# Patient Record
Sex: Male | Born: 1975 | Race: White | Hispanic: No | Marital: Single | Smoking: Current some day smoker
Health system: Southern US, Community
[De-identification: ages and names within clinical notes are randomized; demographics above are authoritative.]

## PROBLEM LIST (undated history)

## (undated) DIAGNOSIS — M722 Plantar fascial fibromatosis: Secondary | ICD-10-CM

---

## 2000-06-23 ENCOUNTER — Encounter: Payer: Self-pay | Admitting: Emergency Medicine

## 2000-06-23 ENCOUNTER — Emergency Department (HOSPITAL_COMMUNITY): Admission: EM | Admit: 2000-06-23 | Discharge: 2000-06-23 | Payer: Self-pay | Admitting: Emergency Medicine

## 2014-02-17 ENCOUNTER — Emergency Department (HOSPITAL_COMMUNITY)
Admission: EM | Admit: 2014-02-17 | Discharge: 2014-02-17 | Discharge status: Against Medical Advice | Payer: Self-pay | Attending: Emergency Medicine | Admitting: Emergency Medicine

## 2014-02-17 DIAGNOSIS — Z720 Tobacco use: Secondary | ICD-10-CM | POA: Insufficient documentation

## 2014-02-17 DIAGNOSIS — Z5329 Procedure and treatment not carried out because of patient's decision for other reasons: Secondary | ICD-10-CM | POA: Insufficient documentation
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
## 2014-02-19 ENCOUNTER — Encounter (HOSPITAL_COMMUNITY): Payer: Self-pay

## 2014-02-19 ENCOUNTER — Emergency Department (HOSPITAL_COMMUNITY)
Admission: EM | Admit: 2014-02-19 | Discharge: 2014-02-19 | Disposition: A | Discharge status: Home / Self Care | Payer: Self-pay | Attending: Emergency Medicine | Admitting: Emergency Medicine

## 2014-02-19 DIAGNOSIS — M722 Plantar fascial fibromatosis: Secondary | ICD-10-CM | POA: Insufficient documentation

## 2014-02-19 DIAGNOSIS — H53149 Visual discomfort, unspecified: Secondary | ICD-10-CM | POA: Insufficient documentation

## 2014-02-19 DIAGNOSIS — Z760 Encounter for issue of repeat prescription: Secondary | ICD-10-CM | POA: Insufficient documentation

## 2014-02-19 DIAGNOSIS — Z720 Tobacco use: Secondary | ICD-10-CM | POA: Insufficient documentation

## 2014-02-19 HISTORY — DX: Plantar fascial fibromatosis: M72.2

## 2014-02-19 MED ORDER — TRAMADOL HCL 50 MG PO TABS: 50.0000 mg | ORAL_TABLET | Freq: Four times a day (QID) | ORAL | Status: DC | PRN

## 2014-02-19 MED ORDER — KETOROLAC TROMETHAMINE 60 MG/2ML IM SOLN
60.0000 mg | Freq: Once | INTRAMUSCULAR | Status: AC
  Administered 2014-02-19: 60 mg via INTRAMUSCULAR
  Filled 2014-02-19: qty 2

## 2014-02-19 MED ORDER — TRAMADOL HCL 50 MG PO TABS
50.0000 mg | ORAL_TABLET | Freq: Once | ORAL | Status: AC
  Administered 2014-02-19: 50 mg via ORAL
  Filled 2014-02-19: qty 1

## 2014-02-19 NOTE — ED Notes (Signed)
Correction-one rx
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
## 2014-02-19 NOTE — Discharge Instructions (Signed)
Plantar Fasciitis  Plantar fasciitis is a common condition that causes foot pain. It is soreness (inflammation) of the band of tough fibrous tissue on the bottom of the foot that runs from the heel bone (calcaneus) to the ball of the foot. The cause of this soreness may be from excessive standing, poor fitting shoes, running on hard surfaces, being overweight, having an abnormal walk, or overuse (this is common in runners) of the painful foot or feet. It is also common in aerobic exercise dancers and ballet dancers.  SYMPTOMS   Most people with plantar fasciitis complain of:   Severe pain in the morning on the bottom of their foot especially when taking the first steps out of bed. This pain recedes after a few minutes of walking.   Severe pain is experienced also during walking following a long period of inactivity.   Pain is worse when walking barefoot or up stairs  DIAGNOSIS    Your caregiver will diagnose this condition by examining and feeling your foot.   Special tests such as X-rays of your foot, are usually not needed.  PREVENTION    Consult a sports medicine professional before beginning a new exercise program.   Walking programs offer a good workout. With walking there is a lower chance of overuse injuries common to runners. There is less impact and less jarring of the joints.   Begin all new exercise programs slowly. If problems or pain develop, decrease the amount of time or distance until you are at a comfortable level.   Wear good shoes and replace them regularly.   Stretch your foot and the heel cords at the back of the ankle (Achilles tendon) both before and after exercise.   Run or exercise on even surfaces that are not hard. For example, asphalt is better than pavement.   Do not run barefoot on hard surfaces.   If using a treadmill, vary the incline.   Do not continue to workout if you have foot or joint problems. Seek professional help if they do not improve.  HOME CARE INSTRUCTIONS     Avoid activities that cause you pain until you recover.   Use ice or cold packs on the problem or painful areas after working out.   Only take over-the-counter or prescription medicines for pain, discomfort, or fever as directed by your caregiver.   Soft shoe inserts or athletic shoes with air or gel sole cushions may be helpful.   If problems continue or become more severe, consult a sports medicine caregiver or your own health care provider. Cortisone is a potent anti-inflammatory medication that may be injected into the painful area. You can discuss this treatment with your caregiver.  MAKE SURE YOU:    Understand these instructions.   Will watch your condition.   Will get help right away if you are not doing well or get worse.  Document Released: 12/01/2000 Document Revised: 05/31/2011 Document Reviewed: 01/31/2008  ExitCare Patient Information 2015 [employer]. This information is not intended to replace advice given to you by your health care provider. Make sure you discuss any questions you have with your health care provider.

## 2014-03-11 ENCOUNTER — Emergency Department (HOSPITAL_COMMUNITY): Payer: Self-pay

## 2014-03-11 ENCOUNTER — Encounter (HOSPITAL_COMMUNITY): Payer: Self-pay | Admitting: *Deleted

## 2014-03-11 ENCOUNTER — Emergency Department (HOSPITAL_COMMUNITY)
Admission: EM | Admit: 2014-03-11 | Discharge: 2014-03-11 | Disposition: A | Discharge status: Home / Self Care | Payer: Self-pay | Attending: Emergency Medicine | Admitting: Emergency Medicine

## 2014-03-11 DIAGNOSIS — M79671 Pain in right foot: Secondary | ICD-10-CM | POA: Insufficient documentation

## 2014-03-11 DIAGNOSIS — M722 Plantar fascial fibromatosis: Secondary | ICD-10-CM | POA: Insufficient documentation

## 2014-03-11 DIAGNOSIS — Z720 Tobacco use: Secondary | ICD-10-CM | POA: Insufficient documentation

## 2014-03-11 MED ORDER — TRAMADOL HCL 50 MG PO TABS: 50.0000 mg | ORAL_TABLET | Freq: Four times a day (QID) | ORAL | Status: DC | PRN

## 2014-03-11 MED ORDER — KETOROLAC TROMETHAMINE 60 MG/2ML IM SOLN
30.0000 mg | Freq: Once | INTRAMUSCULAR | Status: AC
  Administered 2014-03-11: 30 mg via INTRAMUSCULAR
  Filled 2014-03-11: qty 2

## 2014-03-11 NOTE — ED Provider Notes (Signed)
CSN: [id number]     Arrival date & time 03/11/14  0818 History   First MD Initiated Contact with Patient 03/11/14 [phone]     No chief complaint on file.    (Consider location/radiation/quality/duration/timing/severity/associated sxs/prior Treatment) Patient is a 38 y.o. male presenting with lower extremity pain.  Foot Pain This is a new problem. Episode onset: 4 days. The problem occurs constantly. The problem has not changed since onset.Pertinent negatives include no chest pain, no abdominal pain and no shortness of breath. Exacerbated by: standing, palpation, flexion/extension. Nothing relieves the symptoms. He has tried a cold compress (nsaids, indomethacin) for the symptoms. The treatment provided moderate relief.    Past Medical History  Diagnosis Date  . Plantar fasciitis    No past surgical history on file. No family history on file. History  Substance Use Topics  . Smoking status: Current Some Day Smoker  . Smokeless tobacco: Not on file  . Alcohol Use: No    Review of Systems  Respiratory: Negative for shortness of breath.   Cardiovascular: Negative for chest pain.  Gastrointestinal: Negative for abdominal pain.  All other systems reviewed and are negative.     Allergies  Review of patient's allergies indicates no known allergies.  Home Medications   Prior to Admission medications   Medication Sig Start Date End Date Taking? Authorizing Provider  acetaminophen (TYLENOL) 500 MG tablet Take 500 mg by mouth 2 (two) times daily as needed for moderate pain.    Historical Provider, MD  indomethacin (INDOCIN) 25 MG capsule Take 25-50 mg by mouth every 8 (eight) hours as needed for moderate pain.    Historical Provider, MD  traMADol (ULTRAM) 50 MG tablet Take 1 tablet (50 mg total) by mouth every 6 (six) hours as needed. 02/19/14   Tiffany Irine SealG Greene, PA-C  traMADol (ULTRAM) 50 MG tablet Take 50 mg by mouth every 6 (six) hours as needed for moderate pain.    Historical  Provider, MD   There were no vitals taken for this visit. Physical Exam  Constitutional: He is oriented to person, place, and time. He appears well-developed and well-nourished.  HENT:  Head: Normocephalic and atraumatic.  Eyes: Conjunctivae and EOM are normal.  Neck: Normal range of motion. Neck supple.  Cardiovascular: Normal rate, regular rhythm and normal heart sounds.   Pulmonary/Chest: Effort normal and breath sounds normal. No respiratory distress.  Abdominal: He exhibits no distension. There is no tenderness. There is no rebound and no guarding.  Musculoskeletal: Normal range of motion.       Right foot: There is tenderness and bony tenderness. There is no swelling and no deformity.  2+ dp and pt pulses  Neurological: He is alert and oriented to person, place, and time.  Skin: Skin is warm and dry.  Vitals reviewed.   ED Course  Procedures (including critical care time) Labs Review Labs Reviewed - No data to display  Imaging Review No results found.   EKG Interpretation None      MDM   Final diagnoses:  None    38 y.o. male with pertinent PMH of plantar fascitis presents with recurrent R foot pain identical to prior flares over the last year.  Exam as above with tenderness, however nothing focal and this is mild.  XR unremarkable.  This is likely plantar fascitis.  I discussed with the patient that treatment of chronic pain in the emergency department is inappropriate and strongly encouraged him to follow-up with his primary care doctor and  either an orthopedist or a podiatrist. I informed him that I would prescribe Ultram this time, however it was imperative that he followed up for his chronic pain.  DC home in stable condition  I have reviewed all laboratory and imaging studies if ordered as above  1. Right foot pain         Mirian MoMatthew Gentry, MD 03/11/14 [phone]

## 2014-03-11 NOTE — Progress Notes (Signed)
  CARE MANAGEMENT ED NOTE 03/11/2014  Patient:  Scott Abbott,Scott Abbott   Account Number:  [id number]  Date Initiated:  03/11/2014  Documentation initiated by:  Edd ArbourGIBBS,KIMBERLY  Subjective/Objective Assessment:   38 yr old self pay [place] pt hx of right foot plantar fascitis x1 year.. Has taken tramadol in the past. Pt is out of medications. 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 Pt has tried     Subjective/Objective Assessment Detail:   aleve and motrin as well with no relief.         no pcp listed Pt requested medication assistance per ED RN, Silas FloodSheila  [email or web address] [facility] 50 mg for $40     Action/Plan:   ED CM spoke with ED US, KAren, ED RN Velna HatchetSheila about pt medication assistance Cm discussed cost at [facility] and [facility] informed by ED RN and he left prior to CM review of need for pcp for further Rx assistance for   Action/Plan Detail:   chronic issue with plantar fascitis x 1 yr   Anticipated DC Date:  03/11/2014     Status Recommendation to Physician:   Result of Recommendation:    Other ED Services  Consult Working Plan    DC Planning Services  Other  Outpatient Services - Pt will follow up  Medication Assistance  PCP issues    Choice offered to / List presented to:            Status of service:  Completed, signed off  ED Comments:   ED Comments Detail:

## 2014-03-11 NOTE — ED Notes (Signed)
Pt reports hx of right foot plantar fascitis x1 year.. Has taken tramadol in the past. Pt is out of medications. 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 Pt has tried aleve and motrin as well with no relief.

## 2014-03-11 NOTE — Discharge Instructions (Signed)
Plantar Fasciitis  Plantar fasciitis is a common condition that causes foot pain. It is soreness (inflammation) of the band of tough fibrous tissue on the bottom of the foot that runs from the heel bone (calcaneus) to the ball of the foot. The cause of this soreness may be from excessive standing, poor fitting shoes, running on hard surfaces, being overweight, having an abnormal walk, or overuse (this is common in runners) of the painful foot or feet. It is also common in aerobic exercise dancers and ballet dancers.  SYMPTOMS   Most people with plantar fasciitis complain of:   Severe pain in the morning on the bottom of their foot especially when taking the first steps out of bed. This pain recedes after a few minutes of walking.   Severe pain is experienced also during walking following a long period of inactivity.   Pain is worse when walking barefoot or up stairs  DIAGNOSIS    Your caregiver will diagnose this condition by examining and feeling your foot.   Special tests such as X-rays of your foot, are usually not needed.  PREVENTION    Consult a sports medicine professional before beginning a new exercise program.   Walking programs offer a good workout. With walking there is a lower chance of overuse injuries common to runners. There is less impact and less jarring of the joints.   Begin all new exercise programs slowly. If problems or pain develop, decrease the amount of time or distance until you are at a comfortable level.   Wear good shoes and replace them regularly.   Stretch your foot and the heel cords at the back of the ankle (Achilles tendon) both before and after exercise.   Run or exercise on even surfaces that are not hard. For example, asphalt is better than pavement.   Do not run barefoot on hard surfaces.   If using a treadmill, vary the incline.   Do not continue to workout if you have foot or joint problems. Seek professional help if they do not improve.  HOME CARE INSTRUCTIONS     Avoid activities that cause you pain until you recover.   Use ice or cold packs on the problem or painful areas after working out.   Only take over-the-counter or prescription medicines for pain, discomfort, or fever as directed by your caregiver.   Soft shoe inserts or athletic shoes with air or gel sole cushions may be helpful.   If problems continue or become more severe, consult a sports medicine caregiver or your own health care provider. Cortisone is a potent anti-inflammatory medication that may be injected into the painful area. You can discuss this treatment with your caregiver.  MAKE SURE YOU:    Understand these instructions.   Will watch your condition.   Will get help right away if you are not doing well or get worse.  Document Released: 12/01/2000 Document Revised: 05/31/2011 Document Reviewed: 01/31/2008  ExitCare Patient Information 2015 [employer]. This information is not intended to replace advice given to you by your health care provider. Make sure you discuss any questions you have with your health care provider.

## 2014-03-18 ENCOUNTER — Emergency Department (HOSPITAL_COMMUNITY)
Admission: EM | Admit: 2014-03-18 | Discharge: 2014-03-18 | Discharge status: Against Medical Advice | Payer: Self-pay | Attending: Emergency Medicine | Admitting: Emergency Medicine

## 2014-03-18 ENCOUNTER — Encounter (HOSPITAL_COMMUNITY): Payer: Self-pay | Admitting: Emergency Medicine

## 2014-03-18 DIAGNOSIS — Z720 Tobacco use: Secondary | ICD-10-CM | POA: Insufficient documentation

## 2014-03-18 DIAGNOSIS — Z008 Encounter for other general examination: Secondary | ICD-10-CM | POA: Insufficient documentation

## 2014-03-18 DIAGNOSIS — M79673 Pain in unspecified foot: Secondary | ICD-10-CM | POA: Insufficient documentation

## 2014-03-18 DIAGNOSIS — G8929 Other chronic pain: Secondary | ICD-10-CM | POA: Insufficient documentation
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
## 2018-12-13 ENCOUNTER — Emergency Department
Admission: EM | Admit: 2018-12-13 | Discharge: 2018-12-13 | Disposition: A | Discharge status: Home / Self Care | Payer: Self-pay | Attending: Emergency Medicine | Admitting: Emergency Medicine

## 2018-12-13 ENCOUNTER — Other Ambulatory Visit: Payer: Self-pay

## 2018-12-13 ENCOUNTER — Encounter: Payer: Self-pay | Admitting: Emergency Medicine

## 2018-12-13 DIAGNOSIS — G8929 Other chronic pain: Secondary | ICD-10-CM | POA: Insufficient documentation

## 2018-12-13 DIAGNOSIS — Z765 Malingerer [conscious simulation]: Secondary | ICD-10-CM | POA: Insufficient documentation

## 2018-12-13 DIAGNOSIS — F17210 Nicotine dependence, cigarettes, uncomplicated: Secondary | ICD-10-CM | POA: Insufficient documentation

## 2018-12-13 DIAGNOSIS — R2241 Localized swelling, mass and lump, right lower limb: Secondary | ICD-10-CM | POA: Insufficient documentation

## 2018-12-13 DIAGNOSIS — M79671 Pain in right foot: Secondary | ICD-10-CM | POA: Insufficient documentation

## 2018-12-13 MED ORDER — TRAMADOL HCL 50 MG PO TABS
50.0000 mg | ORAL_TABLET | Freq: Once | ORAL | Status: AC
  Administered 2018-12-13: 15:00:00 50 mg via ORAL
  Filled 2018-12-13: qty 1

## 2018-12-13 MED ORDER — LIDOCAINE 5 % EX PTCH
1.0000 | MEDICATED_PATCH | CUTANEOUS | Status: DC
  Administered 2018-12-13: 1 via TRANSDERMAL
  Filled 2018-12-13: qty 1

## 2018-12-13 NOTE — ED Notes (Signed)
Per charge RN, pt to be d/c and can see another provider as needed.

## 2018-12-13 NOTE — ED Notes (Signed)
Pt refused vital signs and refused to sign for d/c. Given d/c paperwork. Pt wheeled out to lobby by significant other.

## 2018-12-13 NOTE — ED Notes (Addendum)
Lou-ann from pt relations to speak with pt soon.

## 2018-12-13 NOTE — ED Triage Notes (Signed)
Pt in via POV, reports severe pain to right heel due to plantar fasciitis, states he has been unable to bear weight for 3-4 days.  Ace wrap in place.  NAD noted at this time.

## 2018-12-13 NOTE — ED Provider Notes (Addendum)
[facility] Emergency Department Provider Note  ____________________________________________   First MD Initiated Contact with Patient 12/13/18 1503     (approximate)  I have reviewed the triage vital signs and the nursing notes.   HISTORY  Chief Complaint Foot Pain    HPI Rodrigo Mcgranahan is a 43 y.o. male presents to the ED complaining of chronic foot pain.  Patient states he ran out of his medications.  He usually takes tramadol.  No new injury.  States just worsening. States he has been through all nsaids , steroids, and other treatments.  Being sent to pain clinic but needs more meds now    Past Medical History:  Diagnosis Date  . Plantar fasciitis     There are no active problems to display for this patient.   History reviewed. No pertinent surgical history.  Prior to Admission medications   Medication Sig Start Date End Date Taking? Authorizing Provider  acetaminophen (TYLENOL) 500 MG tablet Take 500 mg by mouth 2 (two) times daily as needed for moderate pain.    [provider]  indomethacin (INDOCIN) 25 MG capsule Take 25-50 mg by mouth every 8 (eight) hours as needed for moderate pain.    [provider]  traMADol (ULTRAM) 50 MG tablet Take 1 tablet (50 mg total) by mouth every 6 (six) hours as needed. 03/11/14   Debby Freiberg, MD    Allergies Patient has no known allergies.  No family history on file.  Social History Social History   Tobacco Use  . Smoking status: Current Some Day Smoker    Types: Cigarettes, E-cigarettes  . Smokeless tobacco: Never Used  Substance Use Topics  . Alcohol use: No  . Drug use: No    Review of Systems  Constitutional: No fever/chills Eyes: No visual changes. ENT: No sore throat. Respiratory: Denies cough Genitourinary: Negative for dysuria. Musculoskeletal: Negative for back pain.  Positive for chronic foot pain Skin: Negative for rash.     ____________________________________________   PHYSICAL EXAM:  VITAL SIGNS: ED Triage Vitals  Enc Vitals Group     BP 12/13/18 1449 (!) 163/102     Pulse Rate 12/13/18 1449 75     Resp 12/13/18 1449 16     Temp 12/13/18 1449 98.1 F (36.7 C)     Temp Source 12/13/18 1449 Oral     SpO2 12/13/18 1449 97 %     Weight 12/13/18 1450 210 lb (95.3 kg)     Height 12/13/18 1450 5\' 11"  (1.803 m)     Head Circumference --      Peak Flow --      Pain Score 12/13/18 1449 10     Pain Loc --      Pain Edu? --      Excl. in [place]? --     Constitutional: Alert and oriented. Well appearing and in no acute distress. Eyes: Conjunctivae are normal.  Head: Atraumatic. Nose: No congestion/rhinnorhea. Mouth/Throat: Mucous membranes are moist.   Neck:  supple no lymphadenopathy noted Cardiovascular: Normal rate, regular rhythm.  Respiratory: Normal respiratory effort.  No retractions,  GU: deferred Musculoskeletal: FROM all extremities, warm and well perfused, no swelling noted at the foot.  Patient does not even complain when I am pressed hard on the plantar aspect. Neurologic:  Normal speech and language.  Skin:  Skin is warm, dry and intact. No rash noted. Psychiatric: Mood and affect are normal. Speech and behavior are normal.  ____________________________________________   LABS (  all labs ordered are listed, but only abnormal results are displayed)  Labs Reviewed - No data to display ____________________________________________   ____________________________________________  RADIOLOGY    ____________________________________________   PROCEDURES  Procedure(s) performed: Lidoderm patch, tramadol 1 p.o.   Procedures    ____________________________________________   INITIAL IMPRESSION / ASSESSMENT AND PLAN / ED COURSE  Pertinent labs & imaging results that were available during my care of the patient were reviewed by me and considered in my medical decision making (see  chart for details).   Patient is 43 year old male presents emergency department requesting pain medication for his chronic pain of his foot.  Physical exam is unremarkable.  Explained to the patient that we cannot give him a prescription for chronic pain medication but we can give him something for pain here.  Patient becomes very loud and belligerent.  His PDMP score is 620.  He has 30 different prescribers for narcotics in different cities.  I have explained to him once again that we will not give him a prescription for narcotic but will be glad to give him something while he is here.  He was discharged in stable condition although unhappy.    Scorpio Fortin was evaluated in Emergency Department on 12/13/2018 for the symptoms described in the history of present illness. He was evaluated in the context of the global COVID-19 pandemic, which necessitated consideration that the patient might be at risk for infection with the SARS-CoV-2 virus that causes COVID-19. Institutional protocols and algorithms that pertain to the evaluation of patients at risk for COVID-19 are in a state of rapid change based on information released by regulatory bodies including the CDC and federal and state organizations. These policies and algorithms were followed during the patient's care in the ED.   As part of my medical decision making, I reviewed the following data within the electronic MEDICAL RECORD NUMBER Nursing notes reviewed and incorporated, Old chart reviewed, Notes from prior ED visits and [place] Controlled Substance Database  ____________________________________________   FINAL CLINICAL IMPRESSION(S) / ED DIAGNOSES  Final diagnoses:  Chronic foot pain, right  Drug-seeking behavior      NEW MEDICATIONS STARTED DURING THIS VISIT:  Discharge Medication List as of 12/13/2018  3:19 PM       Note:  This document was prepared using Dragon voice recognition software and may include unintentional dictation errors.     Faythe Ghee, PA-C 12/13/18 1528    Sherrie Mustache Roselyn Bering, PA-C 12/13/18 1624    Jene Every, MD 12/13/18 [phone]

## 2018-12-13 NOTE — ED Notes (Signed)
Pt c/o shin splints and plantar phacitis in both feet. C/o R foot/leg pain becoming worse. States ran out of meds. Pain has been getting worse since no meds and states swelling is worse. Slight swelling in foot but not major swelling noted. Pt had ace wrap applied tightly to foot/leg. Removed to assess. Pulses 2+. Foot warm.

## 2018-12-13 NOTE — ED Notes (Signed)
Per Lou-ann, pt requesting to have another provider see him before he is d/c. Will let charge RN know.

## 2018-12-13 NOTE — ED Notes (Signed)
Pt requesting to speak with pt relations. Will have pt relations come to pt's bedside as requested.

## 2019-11-06 ENCOUNTER — Emergency Department (HOSPITAL_COMMUNITY)
Admission: EM | Admit: 2019-11-06 | Discharge: 2019-11-06 | Disposition: A | Discharge status: Home / Self Care | Payer: Self-pay | Attending: Emergency Medicine | Admitting: Emergency Medicine

## 2019-11-06 ENCOUNTER — Encounter (HOSPITAL_COMMUNITY): Payer: Self-pay | Admitting: Emergency Medicine

## 2019-11-06 DIAGNOSIS — M722 Plantar fascial fibromatosis: Secondary | ICD-10-CM | POA: Insufficient documentation

## 2019-11-06 DIAGNOSIS — Y999 Unspecified external cause status: Secondary | ICD-10-CM | POA: Insufficient documentation

## 2019-11-06 DIAGNOSIS — Y939 Activity, unspecified: Secondary | ICD-10-CM | POA: Insufficient documentation

## 2019-11-06 DIAGNOSIS — Y9289 Other specified places as the place of occurrence of the external cause: Secondary | ICD-10-CM | POA: Insufficient documentation

## 2019-11-06 DIAGNOSIS — F17210 Nicotine dependence, cigarettes, uncomplicated: Secondary | ICD-10-CM | POA: Insufficient documentation

## 2019-11-06 DIAGNOSIS — X58XXXA Exposure to other specified factors, initial encounter: Secondary | ICD-10-CM | POA: Insufficient documentation

## 2019-11-06 MED ORDER — OXYCODONE-ACETAMINOPHEN 5-325 MG PO TABS
1.0000 | ORAL_TABLET | Freq: Once | ORAL | Status: AC
  Administered 2019-11-06: 1 via ORAL
  Filled 2019-11-06: qty 1

## 2019-11-06 MED ORDER — TRAMADOL HCL 50 MG PO TABS: 50.0000 mg | ORAL_TABLET | Freq: Three times a day (TID) | ORAL | 0 refills | Status: DC | PRN

## 2019-11-06 MED ORDER — DEXAMETHASONE 4 MG PO TABS: 4.0000 mg | ORAL_TABLET | Freq: Two times a day (BID) | ORAL | 0 refills | Status: DC

## 2019-11-06 MED ORDER — DEXAMETHASONE 4 MG PO TABS: 4.0000 mg | ORAL_TABLET | Freq: Two times a day (BID) | ORAL | 0 refills | Status: AC

## 2019-11-06 MED ORDER — DEXAMETHASONE 4 MG PO TABS
8.0000 mg | ORAL_TABLET | Freq: Once | ORAL | Status: AC
  Administered 2019-11-06: 8 mg via ORAL
  Filled 2019-11-06: qty 2

## 2019-11-06 MED ORDER — TRAMADOL HCL 50 MG PO TABS: 50.0000 mg | ORAL_TABLET | Freq: Three times a day (TID) | ORAL | 0 refills | Status: AC | PRN

## 2019-11-06 NOTE — ED Triage Notes (Signed)
Pt has bone spurs and plantar fascitis in right foot and having pains. Reports fell today at work off and now right foot pains are worse. Reports naproxen isnt helping with his pains and needs something else.

## 2019-11-11 NOTE — ED Provider Notes (Signed)
[facility]-EMERGENCY DEPT Provider Note   CSN: [id number] Arrival date & time: 11/06/19  1243     History Chief Complaint  Patient presents with  . Foot Injury    Scott Abbott is a 44 y.o. male.  HPI    43yM with R foot pain. No trauma. Has of plantar fasciitis. Feels like the same. .worsening over the past several days. No fever, swelling, redness.  Past Medical History:  Diagnosis Date  . Plantar fasciitis     There are no problems to display for this patient.   History reviewed. No pertinent surgical history.     No family history on file.  Social History   Tobacco Use  . Smoking status: Current Some Day Smoker    Types: Cigarettes, E-cigarettes  . Smokeless tobacco: Never Used  Vaping Use  . Vaping Use: Some days  Substance Use Topics  . Alcohol use: No  . Drug use: No    Home Medications Prior to Admission medications   Medication Sig Start Date End Date Taking? Authorizing Provider  acetaminophen (TYLENOL) 500 MG tablet Take 500 mg by mouth 2 (two) times daily as needed for moderate pain.    [provider]  dexamethasone (DECADRON) 4 MG tablet Take 1 tablet (4 mg total) by mouth 2 (two) times daily. 11/06/19   Raeford Razor, MD  indomethacin (INDOCIN) 25 MG capsule Take 25-50 mg by mouth every 8 (eight) hours as needed for moderate pain.    [provider]  traMADol (ULTRAM) 50 MG tablet Take 1 tablet (50 mg total) by mouth every 8 (eight) hours as needed. 11/06/19   Raeford Razor, MD    Allergies    Patient has no known allergies.  Review of Systems   Review of Systems All systems reviewed and negative, other than as noted in HPI.  Physical Exam Updated Vital Signs BP (!) 150/130 (BP Location: Right Arm) Comment: MD aware   Pulse 83   Temp 98.6 F (37 C) (Oral)   Resp 16   SpO2 98%   Physical Exam Vitals and nursing note reviewed.  Constitutional:      General: He is not in acute distress.     Appearance: He is well-developed.  HENT:     Head: Normocephalic and atraumatic.  Eyes:     General:        Right eye: No discharge.        Left eye: No discharge.     Conjunctiva/sclera: Conjunctivae normal.  Cardiovascular:     Rate and Rhythm: Normal rate and regular rhythm.     Heart sounds: Normal heart sounds. No murmur heard.  No friction rub. No gallop.   Pulmonary:     Effort: Pulmonary effort is normal. No respiratory distress.     Breath sounds: Normal breath sounds.  Abdominal:     General: There is no distension.     Palpations: Abdomen is soft.     Tenderness: There is no abdominal tenderness.  Musculoskeletal:        General: Tenderness present.     Cervical back: Neck supple.     Comments: ttp plantar r foot. No swelling, redness. nvi.  Skin:    General: Skin is warm and dry.  Neurological:     Mental Status: He is alert.  Psychiatric:        Behavior: Behavior normal.        Thought Content: Thought content normal.     ED  Results / Procedures / Treatments   Labs (all labs ordered are listed, but only abnormal results are displayed) Labs Reviewed - No data to display  EKG None  Radiology No results found.  Procedures Procedures (including critical care time)  Medications Ordered in ED Medications  oxyCODONE-acetaminophen (PERCOCET/ROXICET) 5-325 MG per tablet 1 tablet (1 tablet Oral Given 11/06/19 1413)  dexamethasone (DECADRON) tablet 8 mg (8 mg Oral Given 11/06/19 1413)    ED Course  I have reviewed the triage vital signs and the nursing notes.  Pertinent labs & imaging results that were available during my care of the patient were reviewed by me and considered in my medical decision making (see chart for details).    MDM Rules/Calculators/A&P                          Symptoms/exam consistent with plantar fasciitis. Symptomatic tx. Podiatry fu. Final Clinical Impression(s) / ED Diagnoses Final diagnoses:  Plantar fasciitis    Rx / DC  Orders ED Discharge Orders         Ordered    dexamethasone (DECADRON) 4 MG tablet  2 times daily,   Status:  Discontinued        11/06/19 1408    traMADol (ULTRAM) 50 MG tablet  Every 8 hours PRN,   Status:  Discontinued        11/06/19 1408    dexamethasone (DECADRON) 4 MG tablet  2 times daily        11/06/19 1411    traMADol (ULTRAM) 50 MG tablet  Every 8 hours PRN        11/06/19 1411           Raeford Razor, MD 11/11/19 2353

## 2021-02-13 ENCOUNTER — Other Ambulatory Visit: Payer: Self-pay

## 2021-02-13 ENCOUNTER — Emergency Department (HOSPITAL_COMMUNITY): Payer: Medicaid Other

## 2021-02-13 ENCOUNTER — Encounter (HOSPITAL_COMMUNITY): Payer: Self-pay | Admitting: Emergency Medicine

## 2021-02-13 ENCOUNTER — Emergency Department (HOSPITAL_COMMUNITY)
Admission: EM | Admit: 2021-02-13 | Discharge: 2021-02-13 | Disposition: A | Discharge status: Home / Self Care | Payer: Medicaid Other | Attending: Emergency Medicine | Admitting: Emergency Medicine

## 2021-02-13 DIAGNOSIS — Z5321 Procedure and treatment not carried out due to patient leaving prior to being seen by health care provider: Secondary | ICD-10-CM | POA: Insufficient documentation

## 2021-02-13 DIAGNOSIS — K4090 Unilateral inguinal hernia, without obstruction or gangrene, not specified as recurrent: Secondary | ICD-10-CM | POA: Insufficient documentation

## 2021-02-13 LAB — CBC WITH DIFFERENTIAL/PLATELET
Abs Immature Granulocytes: 0.03 10*3/uL (ref 0.00–0.07)
Basophils Absolute: 0.1 10*3/uL (ref 0.0–0.1)
Basophils Relative: 1 %
Eosinophils Absolute: 0.2 10*3/uL (ref 0.0–0.5)
Eosinophils Relative: 2 %
HCT: 42.7 % (ref 39.0–52.0)
Hemoglobin: 15 g/dL (ref 13.0–17.0)
Immature Granulocytes: 0 %
Lymphocytes Relative: 26 %
Lymphs Abs: 2.6 10*3/uL (ref 0.7–4.0)
MCH: 31.9 pg (ref 26.0–34.0)
MCHC: 35.1 g/dL (ref 30.0–36.0)
MCV: 90.9 fL (ref 80.0–100.0)
Monocytes Absolute: 0.9 10*3/uL (ref 0.1–1.0)
Monocytes Relative: 9 %
Neutro Abs: 6.3 10*3/uL (ref 1.7–7.7)
Neutrophils Relative %: 62 %
Platelets: 261 10*3/uL (ref 150–400)
RBC: 4.7 MIL/uL (ref 4.22–5.81)
RDW: 12.1 % (ref 11.5–15.5)
WBC: 10.1 10*3/uL (ref 4.0–10.5)
nRBC: 0 % (ref 0.0–0.2)

## 2021-02-13 LAB — BASIC METABOLIC PANEL
Anion gap: 10 (ref 5–15)
BUN: 12 mg/dL (ref 6–20)
CO2: 24 mmol/L (ref 22–32)
Calcium: 9 mg/dL (ref 8.9–10.3)
Chloride: 100 mmol/L (ref 98–111)
Creatinine, Ser: 0.95 mg/dL (ref 0.61–1.24)
GFR, Estimated: >60 mL/min (ref >60)
Glucose, Bld: 94 mg/dL (ref 70–99)
Potassium: 3.1 mmol/L — ABNORMAL LOW (ref 3.5–5.1)
Sodium: 134 mmol/L — ABNORMAL LOW (ref 135–145)

## 2021-02-13 MED ORDER — IOHEXOL 350 MG/ML SOLN
100.0000 mL | Freq: Once | INTRAVENOUS | Status: AC | PRN
  Administered 2021-02-13: 80 mL via INTRAVENOUS

## 2021-02-13 NOTE — ED Triage Notes (Signed)
Patient reports hernia pain x3 days. He reports being dx with bilateral inguinal hernia last month and is scheduled for surgery on 12/16. Pt reports taking gabapentin for pain w/ no relief. Denies n/v.

## 2021-02-13 NOTE — ED Provider Notes (Signed)
Emergency Medicine Provider Triage Evaluation Note  Scott Abbott , a 45 y.o. male  was evaluated in triage.  Pt complains of hernia pain.  Patient states he has known left inguinal hernia.  Went to lift groceries 3 days ago and had severe worsening of pain.  He denies any testicular swelling, difficulty urinating or change in bowel movement he is scheduled for surgery in 3 weeks.  Review of Systems  Positive: Left inguinal pain Negative:   Physical Exam  BP (!) 158/113 (BP Location: Right Arm)   Pulse (!) 101   Temp 98.7 F (37.1 C) (Oral)   Resp 17   SpO2 98%  Gen:   Awake, no distress   Resp:  Normal effort  MSK:   Moves extremities without difficulty  ABD:  Diffuse tenderness left inguinal area Other:    Medical Decision Making  Medically screening exam initiated at 4:34 PM.  Appropriate orders placed.  Scott Abbott was informed that the remainder of the evaluation will be completed by another provider, this initial triage assessment does not replace that evaluation, and the importance of remaining in the ED until their evaluation is complete.  Left inguinal area pain   Tyshan Enderle A, PA-C 02/13/21 1635    Terrilee Files, MD 02/14/21 1054

## 2023-05-03 IMAGING — CT CT ABD-PELV W/ CM
2 of 5 series · 16 of 46 positions shown, 18 images · IV contrast (omnipaque)
Comparison: None.

CLINICAL DATA: Abdominal pain, hernia suspected.

EXAM:
CT ABDOMEN AND PELVIS WITH CONTRAST
TECHNIQUE: Multidetector CT imaging of the abdomen and pelvis was performed
using the standard protocol following bolus administration of
intravenous contrast.
CONTRAST:  80mL OMNIPAQUE IOHEXOL 350 MG/ML SOLN

[Series 2: axial st · axial · 0.76mm/px · z∈[+929,+1414]mm · 13 of 113 slices shown, 15 images]
[im 8/113  soft-tissue]
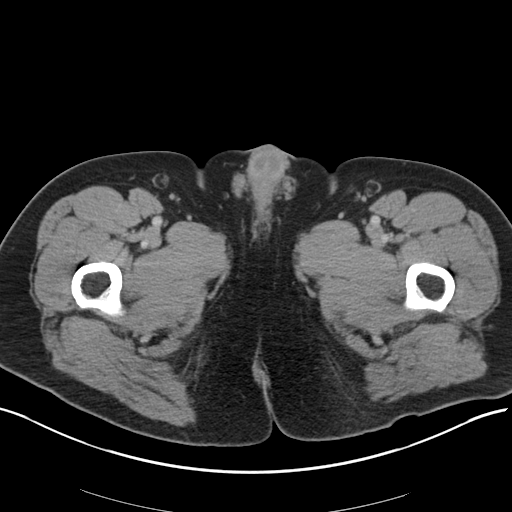
[im 8/113  bone]
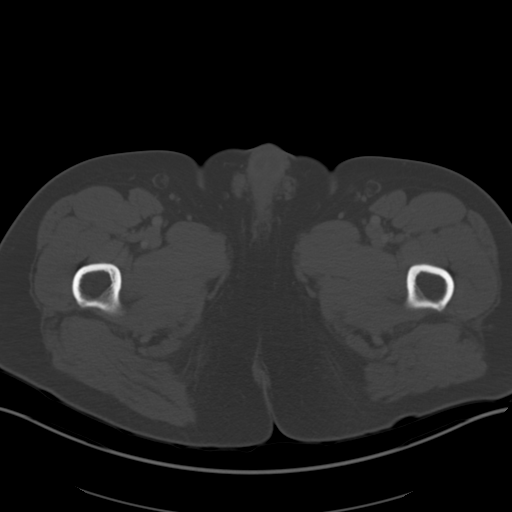
[im 15/113  soft-tissue]
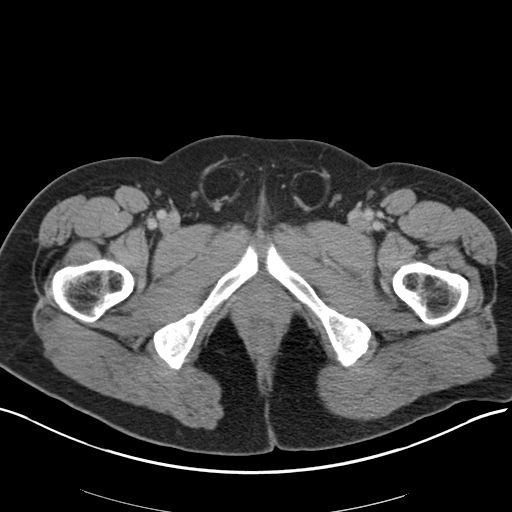
[im 23/113  soft-tissue]
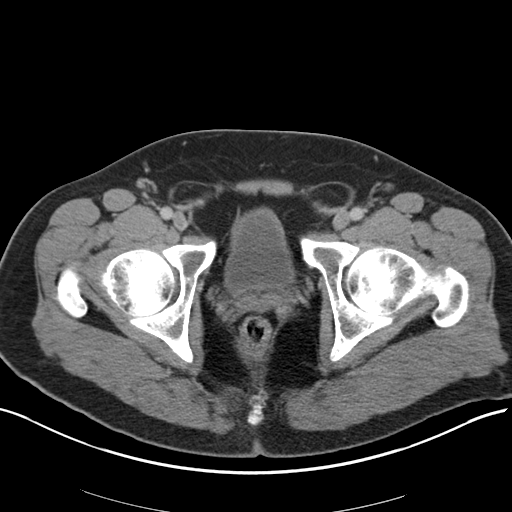
[im 30/113  soft-tissue]
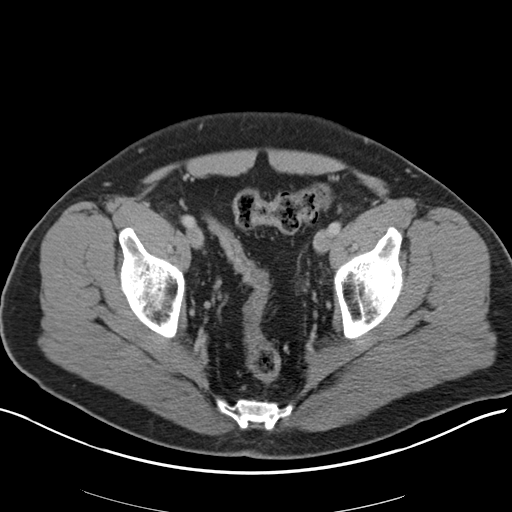
[im 38/113  soft-tissue]
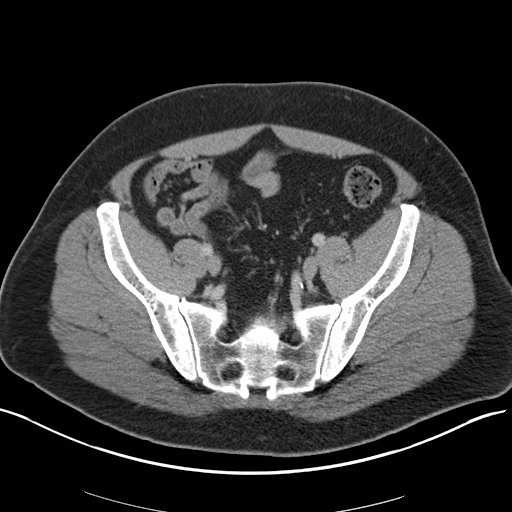
[im 45/113  soft-tissue]
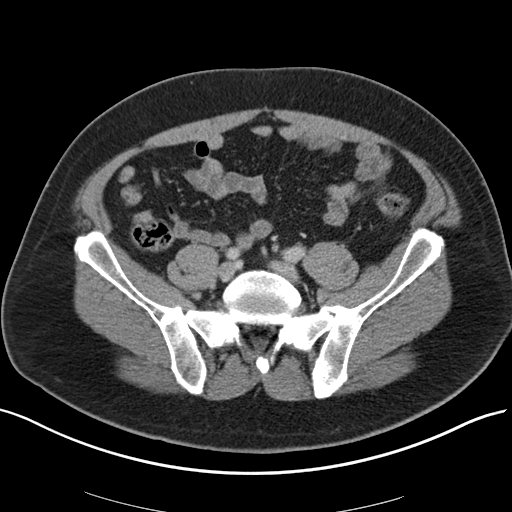
[im 60/113  soft-tissue]
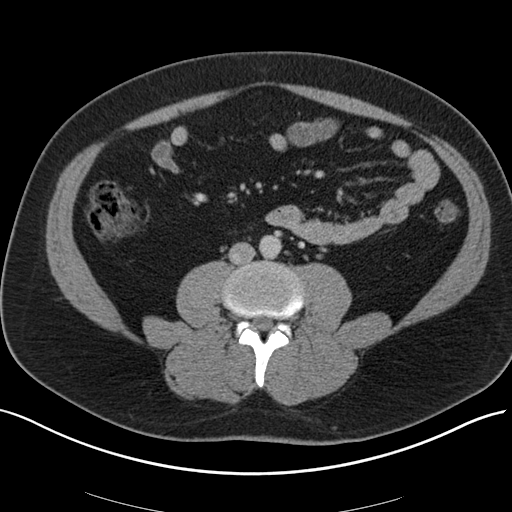
[im 68/113  soft-tissue]
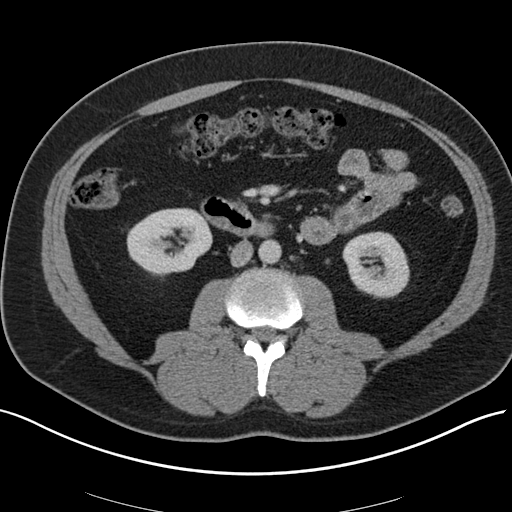
[im 75/113  soft-tissue]
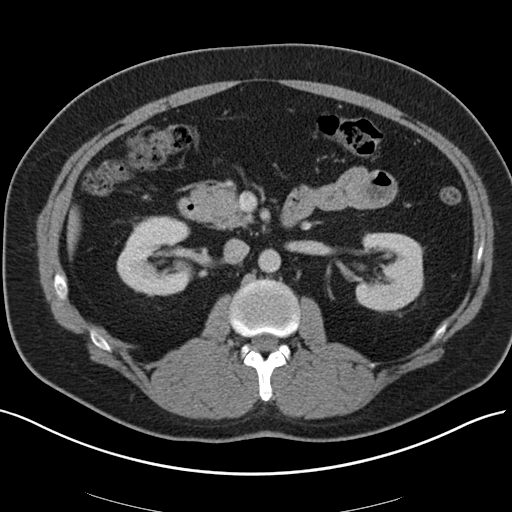
[im 75/113  bone]
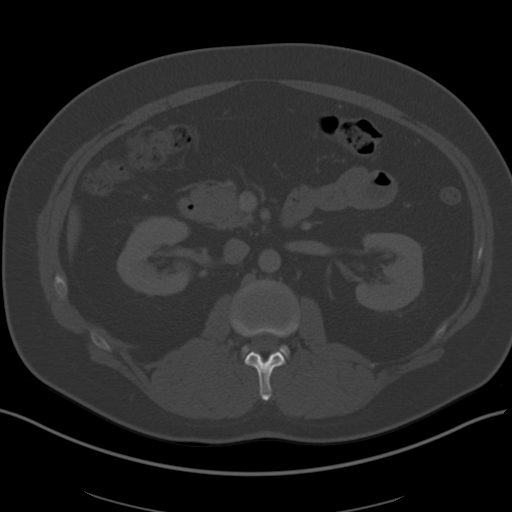
[im 83/113  soft-tissue]
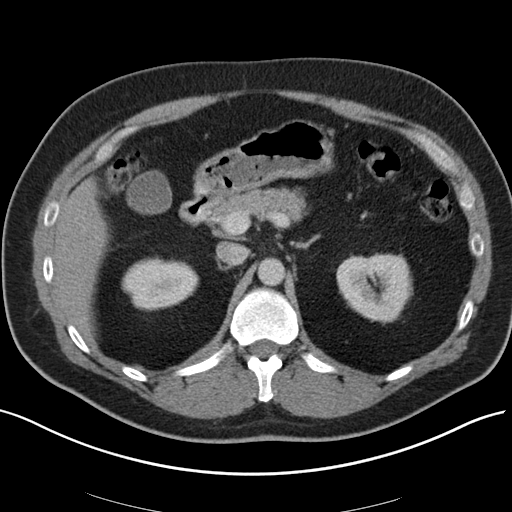
[im 90/113  soft-tissue]
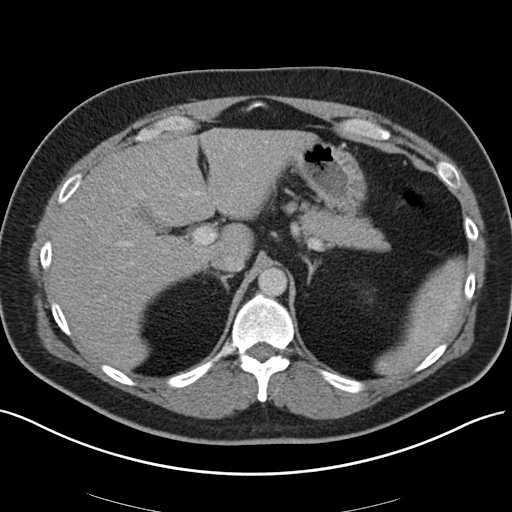
[im 98/113  soft-tissue]
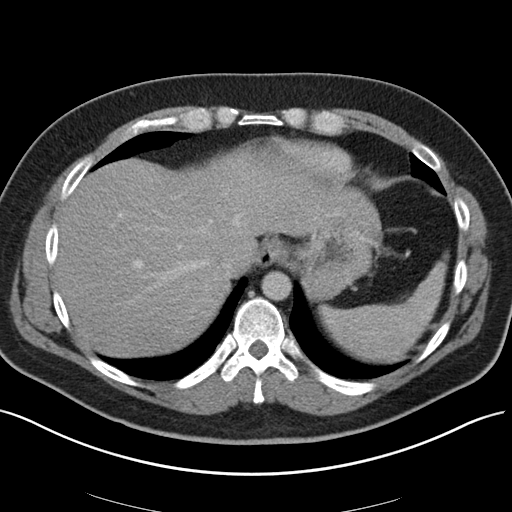
[im 105/113  soft-tissue]
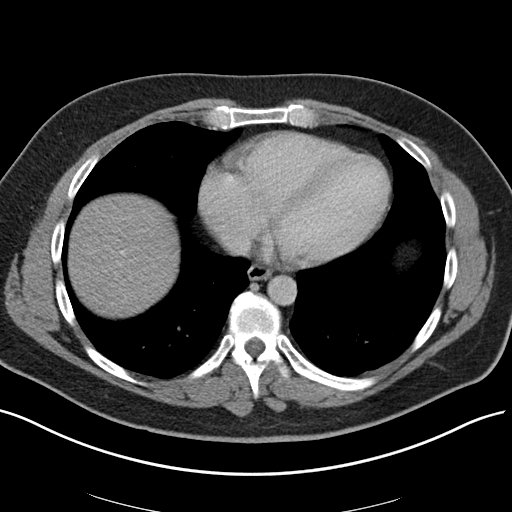

[Series 6: coronal st · coronal · 0.82mm/px · 3 of 156 slices shown]
[im 52/156  soft-tissue]
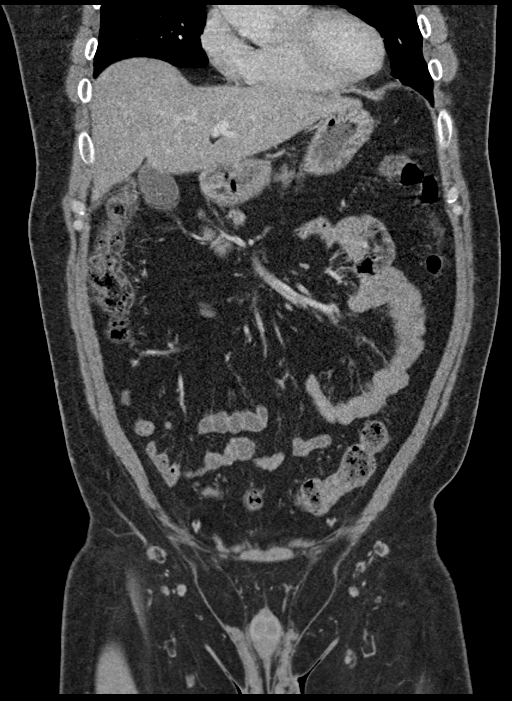
[im 69/156  soft-tissue]
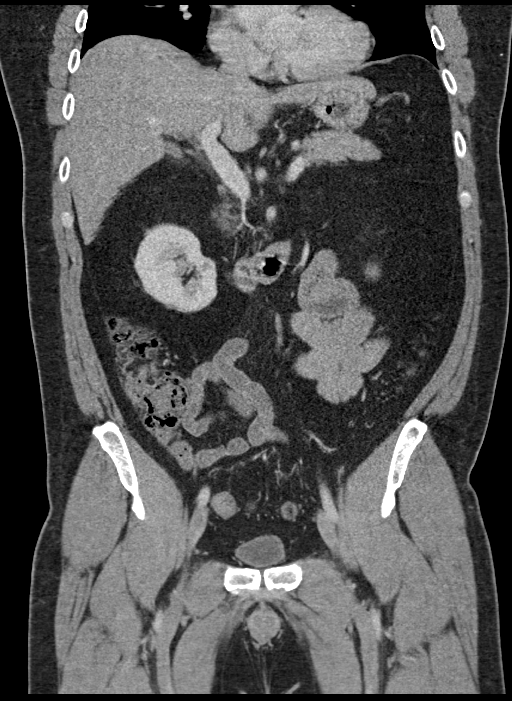
[im 87/156  soft-tissue]
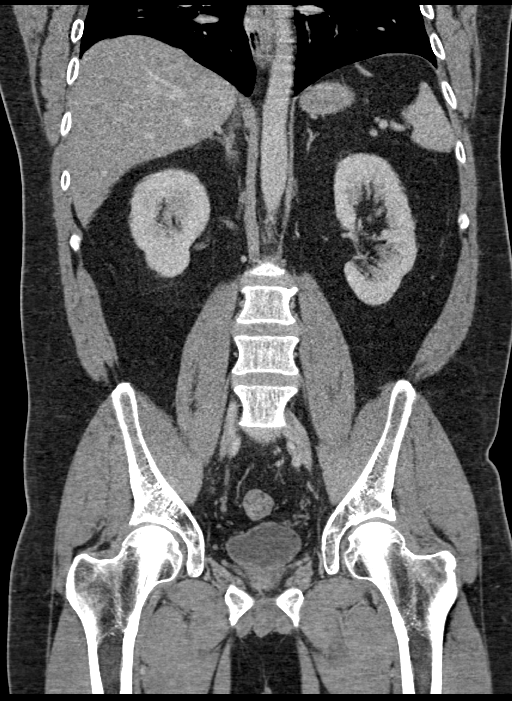

[16 of 46 positions shown; findings below may reference images not displayed]

FINDINGS: Lower chest: No acute abnormality.

Hepatobiliary: No focal liver abnormality is seen. No gallstones,
gallbladder wall thickening, or biliary dilatation.

Pancreas: Unremarkable. No pancreatic ductal dilatation or
surrounding inflammatory changes.

Spleen: Normal in size without focal abnormality.

Adrenals/Urinary Tract: There is a rounded hypodensity in the right
kidney which is too small to characterize, most likely a cyst.
Otherwise, adrenal glands, kidneys and bladder are within normal
limits.

Stomach/Bowel: Stomach is within normal limits. Appendix appears
normal. No evidence of bowel wall thickening, distention, or
inflammatory changes.

Vascular/Lymphatic: No significant vascular findings are present. No
enlarged abdominal or pelvic lymph nodes.

Reproductive: Prostate is unremarkable.

Other: There are small to moderate-sized bilateral fat containing
inguinal hernias. There is no ascites or free air.

Musculoskeletal: No acute or significant osseous findings.
IMPRESSION: 1. Small to moderate-sized bilateral fat containing inguinal
hernias.
2. No other acute localizing process in the abdomen or pelvis.
